# Patient Record
Sex: Female | Born: 2000 | Race: Black or African American | Hispanic: No | Marital: Single | Smoking: Never smoker
Health system: Southern US, Community
[De-identification: ages and names within clinical notes are randomized; demographics above are authoritative.]

## PROBLEM LIST (undated history)

## (undated) HISTORY — PX: MOUTH SURGERY: SHX715

---
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
## 2021-02-26 DIAGNOSIS — H5213 Myopia, bilateral: Secondary | ICD-10-CM | POA: Diagnosis not present

## 2021-03-27 DIAGNOSIS — H5213 Myopia, bilateral: Secondary | ICD-10-CM | POA: Diagnosis not present

## 2021-11-01 ENCOUNTER — Emergency Department (HOSPITAL_COMMUNITY)
Admission: EM | Admit: 2021-11-01 | Discharge: 2021-11-01 | Disposition: A | Discharge status: Home / Self Care | Payer: Medicaid Other | Attending: Emergency Medicine | Admitting: Emergency Medicine

## 2021-11-01 ENCOUNTER — Other Ambulatory Visit: Payer: Self-pay

## 2021-11-01 ENCOUNTER — Emergency Department (HOSPITAL_COMMUNITY): Payer: Medicaid Other

## 2021-11-01 ENCOUNTER — Encounter (HOSPITAL_COMMUNITY): Payer: Self-pay

## 2021-11-01 DIAGNOSIS — Z20822 Contact with and (suspected) exposure to covid-19: Secondary | ICD-10-CM | POA: Insufficient documentation

## 2021-11-01 DIAGNOSIS — R062 Wheezing: Secondary | ICD-10-CM | POA: Diagnosis not present

## 2021-11-01 DIAGNOSIS — R059 Cough, unspecified: Secondary | ICD-10-CM | POA: Diagnosis not present

## 2021-11-01 DIAGNOSIS — B9789 Other viral agents as the cause of diseases classified elsewhere: Secondary | ICD-10-CM | POA: Diagnosis not present

## 2021-11-01 DIAGNOSIS — J069 Acute upper respiratory infection, unspecified: Secondary | ICD-10-CM | POA: Diagnosis not present

## 2021-11-01 LAB — RESP PANEL BY RT-PCR (FLU A&B, COVID) ARPGX2
Influenza A by PCR: NEGATIVE
Influenza B by PCR: NEGATIVE
SARS Coronavirus 2 by RT PCR: NEGATIVE

## 2021-11-01 MED ORDER — PREDNISONE 50 MG PO TABS: 50.0000 mg | ORAL_TABLET | Freq: Every day | ORAL | 0 refills | Status: DC

## 2021-11-01 MED ORDER — PREDNISONE 20 MG PO TABS
60.0000 mg | ORAL_TABLET | Freq: Once | ORAL | Status: AC
Start: 2021-11-01 — End: 2021-11-01
  Administered 2021-11-01: 60 mg via ORAL
  Filled 2021-11-01: qty 3

## 2021-11-01 MED ORDER — ALBUTEROL SULFATE HFA 108 (90 BASE) MCG/ACT IN AERS: 1.0000 | INHALATION_SPRAY | Freq: Four times a day (QID) | RESPIRATORY_TRACT | 0 refills | Status: AC | PRN

## 2021-11-01 MED ORDER — ALBUTEROL SULFATE HFA 108 (90 BASE) MCG/ACT IN AERS
2.0000 | INHALATION_SPRAY | Freq: Once | RESPIRATORY_TRACT | Status: AC
  Administered 2021-11-01: 2 via RESPIRATORY_TRACT
  Filled 2021-11-01: qty 6.7

## 2021-11-01 MED ORDER — ALBUTEROL SULFATE HFA 108 (90 BASE) MCG/ACT IN AERS: 1.0000 | INHALATION_SPRAY | Freq: Four times a day (QID) | RESPIRATORY_TRACT | 0 refills | Status: DC | PRN

## 2021-11-01 MED ORDER — PREDNISONE 50 MG PO TABS: 50.0000 mg | ORAL_TABLET | Freq: Every day | ORAL | 0 refills | Status: AC

## 2021-11-01 NOTE — ED Provider Notes (Signed)
Emergency Medicine Provider Triage Evaluation Note  Jillian Banks , a 20 y.o. female  was evaluated in triage.  Pt complains of cough, wheeze.  No history of asthma.  Girlfriend with similar symptoms.  Diagnosed with viral infection.  No chest pain.  Has some myalgias.  Cough productive of green sputum.  No sore throat.  Denies chance of pregnancy.  Review of Systems  Positive: cough, Wheeze, shortness of breath Negative: Fever, emesis, abdominal pain  Physical Exam  BP 136/82 (BP Location: Left Arm)   Pulse 98   Temp 99.2 F (37.3 C) (Oral)   Resp 16   SpO2 96%  Gen:   Awake, no distress   Resp:  Normal effort, wet cough.  Mild expiratory wheeze. MSK:   Moves extremities without difficulty  Other:    Medical Decision Making  Medically screening exam initiated at 1:15 PM.  Appropriate orders placed.  Cierah Crader was informed that the remainder of the evaluation will be completed by another provider, this initial triage assessment does not replace that evaluation, and the importance of remaining in the ED until their evaluation is complete.  Cough, wheeze   Rilya Longo A, PA-C 11/01/21 1317    Terald Sleeper, MD 11/01/21 [phone]

## 2021-11-01 NOTE — ED Notes (Signed)
An After Visit Summary was printed and given to the patient. Discharge instructions given and no further questions at this time.  

## 2021-11-01 NOTE — ED Triage Notes (Signed)
Patient c/o a productive cough with green sputum and wheezing x 3 days.

## 2021-11-01 NOTE — Discharge Instructions (Signed)
It was our pleasure taking care of you here in the emergency department today  Chest x-ray did not show any evidence of infection  COVID, flu test is negative  You likely have bronchitis.  Take the steroids as prescribed.  You may also do the inhaler, 2 to 4 puffs every 4 hours.  Make sure to hydrate, rest at home  Return for new worsening symptoms

## 2021-11-01 NOTE — ED Provider Notes (Signed)
[facility]EMERGENCY [facility]PT Provider Note   CSN: [id number] Arrival date & time: 11/01/21  1230    History Chief Complaint  Patient presents with   Wheezing   Cough    Jillian Banks is a 20 y.o. female with no significant past medical history who presents for evaluation of cough and wheeze. Had some family members with similar symptoms.  The person was diagnosed with viral illness, had negative COVID, flu.  Patient feels like she has a wheeze. She is coughing up clear and green sputum.  Feels like she has had some chills however has not taken her temperature.  No headache, neck pain, neck stiffness, chest pain, shortness of breath, sore throat, abdominal pain, urinary symptoms.  Denies chance of pregnancy.  No prior history of asthma.  Denies additional aggravating or alleviating factors  History obtained from patient and past medical records.  No interpreter is used.  HPI     History reviewed. No pertinent past medical history.  There are no problems to display for this patient.   Past Surgical History:  Procedure Laterality Date   MOUTH SURGERY       OB History   No obstetric history on file.     Family History  Problem Relation Age of Onset   Hypertension Mother     Social History   Tobacco Use   Smoking status: Never  Vaping Use   Vaping Use: Never used  Substance Use Topics   Alcohol use: Never   Drug use: Never    Home Medications Prior to Admission medications   Medication Sig Start Date End Date Taking? Authorizing Provider  albuterol (VENTOLIN HFA) 108 (90 Base) MCG/ACT inhaler Inhale 1-2 puffs into the lungs every 6 (six) hours as needed for wheezing or shortness of breath. 11/01/21  Yes Torry Adamczak A, PA-C  predniSONE (DELTASONE) 50 MG tablet Take 1 tablet (50 mg total) by mouth daily. 11/01/21  Yes Fleet Higham A, PA-C  omeprazole (PRILOSEC) 20 MG capsule Take 1 capsule (20 mg total) by mouth daily. 04/26/18   Georgetta Haber, NP  ondansetron (ZOFRAN) 4 MG tablet Take 1 tablet (4 mg total) by mouth every 8 (eight) hours as needed for nausea or vomiting. 04/26/18   Georgetta Haber, NP    Allergies    Augmentin [amoxicillin-pot clavulanate]  Review of Systems   Review of Systems  Constitutional:  Positive for fatigue.  HENT: Negative.    Respiratory:  Positive for cough and wheezing. Negative for apnea, choking, chest tightness, shortness of breath and stridor.   Cardiovascular: Negative.   Gastrointestinal: Negative.   Genitourinary: Negative.   Musculoskeletal: Negative.   Skin: Negative.   Neurological: Negative.   All other systems reviewed and are negative.  Physical Exam Updated Vital Signs BP 136/82 (BP Location: Left Arm)   Pulse 98   Temp 99.2 F (37.3 C) (Oral)   Resp 16   Ht 5' (1.524 m)   Wt 49 kg   LMP 10/01/2021 (Exact Date)   SpO2 96%   BMI 21.09 kg/m   Physical Exam Vitals and nursing note reviewed.  Constitutional:      General: She is not in acute distress.    Appearance: She is well-developed. She is not ill-appearing, toxic-appearing or diaphoretic.  HENT:     Head: Normocephalic and atraumatic.     Nose: Nose normal.     Mouth/Throat:     Mouth: Mucous membranes are moist.  Eyes:  Pupils: Pupils are equal, round, and reactive to light.  Cardiovascular:     Rate and Rhythm: Normal rate.     Pulses: Normal pulses.     Heart sounds: Normal heart sounds.     Comments: Heart without murmur Pulmonary:     Effort: Pulmonary effort is normal. No respiratory distress.     Breath sounds: Wheezing present.     Comments: Expiratory wheeze, wet cough.  No respiratory distress Chest:     Comments: Nontender no crepitus Abdominal:     General: Bowel sounds are normal. There is no distension.     Palpations: Abdomen is soft.     Tenderness: There is no abdominal tenderness. There is no guarding or rebound.  Musculoskeletal:        General: No swelling,  tenderness, deformity or signs of injury. Normal range of motion.     Cervical back: Normal range of motion.     Right lower leg: No edema.     Left lower leg: No edema.     Comments: Compartments soft, moves all 4 extremities  Skin:    General: Skin is warm and dry.     Capillary Refill: Capillary refill takes less than 2 seconds.  Neurological:     General: No focal deficit present.     Mental Status: She is alert and oriented to person, place, and time.  Psychiatric:        Mood and Affect: Mood normal.    ED Results / Procedures / Treatments   Labs (all labs ordered are listed, but only abnormal results are displayed) Labs Reviewed  RESP PANEL BY RT-PCR (FLU A&B, COVID) [id number]    EKG None  Radiology DG Chest 2 View  Result Date: 11/01/2021 CLINICAL DATA:  20 year old female with cough and wheezing. EXAM: CHEST - 2 VIEW COMPARISON:  None. FINDINGS: The mediastinal contours are within normal limits. No cardiomegaly. The lungs are clear bilaterally without evidence of focal consolidation, pleural effusion, or pneumothorax. No acute osseous abnormality. IMPRESSION: No acute cardiopulmonary process. Electronically Signed   By: Marliss Coots M.D.   On: 11/01/2021 14:15    Procedures Procedures   Medications Ordered in ED Medications  albuterol (VENTOLIN HFA) 108 (90 Base) MCG/ACT inhaler 2 puff (2 puffs Inhalation Given 11/01/21 1328)  predniSONE (DELTASONE) tablet 60 mg (60 mg Oral Given 11/01/21 1526)    ED Course  I have reviewed the triage vital signs and the nursing notes.  Pertinent labs & imaging results that were available during my care of the patient were reviewed by me and considered in my medical decision making (see chart for details).  Here for evaluation of cough and wheeze.  She is afebrile, nonseptic, not ill-appearing.  Family members with similar symptoms.  Does have mild expiratory wheeze.  She has productive cough in room.  Her heart is clear.  Her  abdomen is soft, nontender.  No clinical evidence of VTE on exam.  She denies any chest pain.  She is PERC negative, Wells criteria low risk.  Posterior oropharynx clear.  Will give albuterol.  Chest x-ray personally reviewed does not show any evidence of infiltrates, cardiomegaly, pulmonary edema, pneumothorax COVID, flu negative  Patient reassessed.  Has had some improvement in breath sounds.  Suspect patient with viral URI, likely bronchitis.  DC home with symptomatic management.  Discussed rest, return for new or worsening symptoms, please follow-up with PCP.  She is agreeable.  The patient has been appropriately medically screened and/or  stabilized in the ED. I have low suspicion for any other emergent medical condition which would require further screening, evaluation or treatment in the ED or require inpatient management.  Patient is hemodynamically stable and in no acute distress.  Patient able to ambulate in department prior to ED.  Evaluation does not show acute pathology that would require ongoing or additional emergent interventions while in the emergency department or further inpatient treatment.  I have discussed the diagnosis with the patient and answered all questions.  Pain is been managed while in the emergency department and patient has no further complaints prior to discharge.  Patient is comfortable with plan discussed in room and is stable for discharge at this time.  I have discussed strict return precautions for returning to the emergency department.  Patient was encouraged to follow-up with PCP/specialist refer to at discharge.     MDM Rules/Calculators/A&P                            Final Clinical Impression(s) / ED Diagnoses Final diagnoses:  Viral URI with cough  Wheeze    Rx / DC Orders ED Discharge Orders          Ordered    albuterol (VENTOLIN HFA) 108 (90 Base) MCG/ACT inhaler  Every 6 hours PRN        11/01/21 1549    predniSONE (DELTASONE) 50 MG tablet   Daily        11/01/21 1549             Dazia Lippold A, PA-C 11/01/21 1554    Terald Sleeper, MD 11/01/21 1614

## 2021-12-13 DIAGNOSIS — Z03818 Encounter for observation for suspected exposure to other biological agents ruled out: Secondary | ICD-10-CM | POA: Diagnosis not present

## 2021-12-13 DIAGNOSIS — Z20822 Contact with and (suspected) exposure to covid-19: Secondary | ICD-10-CM | POA: Diagnosis not present

## 2021-12-13 DIAGNOSIS — R051 Acute cough: Secondary | ICD-10-CM | POA: Diagnosis not present

## 2021-12-13 DIAGNOSIS — J069 Acute upper respiratory infection, unspecified: Secondary | ICD-10-CM | POA: Diagnosis not present

## 2022-03-19 ENCOUNTER — Emergency Department (HOSPITAL_COMMUNITY)
Admission: EM | Admit: 2022-03-19 | Discharge: 2022-03-19 | Disposition: A | Discharge status: Home / Self Care | Payer: Medicaid Other | Attending: Emergency Medicine | Admitting: Emergency Medicine

## 2022-03-19 ENCOUNTER — Encounter (HOSPITAL_COMMUNITY): Payer: Self-pay

## 2022-03-19 ENCOUNTER — Other Ambulatory Visit: Payer: Self-pay

## 2022-03-19 DIAGNOSIS — E860 Dehydration: Secondary | ICD-10-CM | POA: Insufficient documentation

## 2022-03-19 DIAGNOSIS — R197 Diarrhea, unspecified: Secondary | ICD-10-CM | POA: Insufficient documentation

## 2022-03-19 DIAGNOSIS — R112 Nausea with vomiting, unspecified: Secondary | ICD-10-CM | POA: Insufficient documentation

## 2022-03-19 DIAGNOSIS — R0981 Nasal congestion: Secondary | ICD-10-CM | POA: Diagnosis not present

## 2022-03-19 DIAGNOSIS — D72829 Elevated white blood cell count, unspecified: Secondary | ICD-10-CM | POA: Diagnosis not present

## 2022-03-19 DIAGNOSIS — J45909 Unspecified asthma, uncomplicated: Secondary | ICD-10-CM | POA: Diagnosis not present

## 2022-03-19 LAB — CBC
HCT: 47.2 % — ABNORMAL HIGH (ref 36.0–46.0)
Hemoglobin: 15.8 g/dL — ABNORMAL HIGH (ref 12.0–15.0)
MCH: 30.4 pg (ref 26.0–34.0)
MCHC: 33.5 g/dL (ref 30.0–36.0)
MCV: 90.9 fL (ref 80.0–100.0)
Platelets: 209 10*3/uL (ref 150–400)
RBC: 5.19 MIL/uL — ABNORMAL HIGH (ref 3.87–5.11)
RDW: 11.5 % (ref 11.5–15.5)
WBC: 16.8 10*3/uL — ABNORMAL HIGH (ref 4.0–10.5)
nRBC: 0 % (ref 0.0–0.2)

## 2022-03-19 LAB — I-STAT BETA HCG BLOOD, ED (MC, WL, AP ONLY): I-stat hCG, quantitative: <5 m[IU]/mL (ref <5)

## 2022-03-19 LAB — COMPREHENSIVE METABOLIC PANEL
ALT: 31 U/L (ref 0–44)
AST: 32 U/L (ref 15–41)
Albumin: 4.8 g/dL (ref 3.5–5.0)
Alkaline Phosphatase: 66 U/L (ref 38–126)
Anion gap: 9 (ref 5–15)
BUN: 11 mg/dL (ref 6–20)
CO2: 23 mmol/L (ref 22–32)
Calcium: 9.8 mg/dL (ref 8.9–10.3)
Chloride: 105 mmol/L (ref 98–111)
Creatinine, Ser: 0.81 mg/dL (ref 0.44–1.00)
GFR, Estimated: >60 mL/min (ref >60)
Glucose, Bld: 134 mg/dL — ABNORMAL HIGH (ref 70–99)
Potassium: 4.1 mmol/L (ref 3.5–5.1)
Sodium: 137 mmol/L (ref 135–145)
Total Bilirubin: 1.1 mg/dL (ref 0.3–1.2)
Total Protein: 8.6 g/dL — ABNORMAL HIGH (ref 6.5–8.1)

## 2022-03-19 LAB — URINALYSIS, ROUTINE W REFLEX MICROSCOPIC
Bilirubin Urine: NEGATIVE
Glucose, UA: NEGATIVE mg/dL
Hgb urine dipstick: NEGATIVE
Ketones, ur: 20 mg/dL — AB
Leukocytes,Ua: NEGATIVE
Nitrite: NEGATIVE
Protein, ur: NEGATIVE mg/dL
Specific Gravity, Urine: 1.025 (ref 1.005–1.030)
pH: 5 (ref 5.0–8.0)

## 2022-03-19 LAB — LIPASE, BLOOD: Lipase: 30 U/L (ref 11–51)

## 2022-03-19 MED ORDER — ONDANSETRON HCL 4 MG/2ML IJ SOLN
4.0000 mg | Freq: Once | INTRAMUSCULAR | Status: AC
  Administered 2022-03-19: 4 mg via INTRAVENOUS
  Filled 2022-03-19: qty 2

## 2022-03-19 MED ORDER — DICYCLOMINE HCL 10 MG PO CAPS
20.0000 mg | ORAL_CAPSULE | Freq: Once | ORAL | Status: AC
  Administered 2022-03-19: 20 mg via ORAL
  Filled 2022-03-19: qty 2

## 2022-03-19 MED ORDER — ONDANSETRON 4 MG PO TBDP: 4.0000 mg | ORAL_TABLET | Freq: Three times a day (TID) | ORAL | 0 refills | Status: AC | PRN

## 2022-03-19 MED ORDER — LACTATED RINGERS IV BOLUS
1000.0000 mL | Freq: Once | INTRAVENOUS | Status: AC
  Administered 2022-03-19: 1000 mL via INTRAVENOUS

## 2022-03-19 MED ORDER — DICYCLOMINE HCL 20 MG PO TABS: 20.0000 mg | ORAL_TABLET | Freq: Two times a day (BID) | ORAL | 0 refills | Status: AC

## 2022-03-19 NOTE — ED Provider Notes (Signed)
?[facility] ?Provider Note ? ? ?CSN: [id number] ?Arrival date & time: 03/19/22  0222 ? ?  ? ?History ? ?Chief Complaint  ?Patient presents with  ? Emesis  ? ? ?Jillian Banks is a 21 y.o. female. ? ? ?Emesis ? ?Patient is a 21 year old female with past medical history significant for asthma ? ?She is presented to emergency room today with complaints of nausea, vomiting, diarrhea she states her symptoms began around 6/7 PM yesterday evening.  Seems that since that time she has had nearly hourly episodes of loose watery diarrhea.  No blood in her stool no fevers no recent antibiotic use no recent travel, also no history of C. difficile. ? ?She states that she has had nonbloody nonbilious emesis at a relatively similar rate as her diarrhea.  She denies any chest pain or difficulty breathing ? ?She states she has also noticed some increased sinus congestion. ?  ? ?Home Medications ?Prior to Admission medications   ?Medication Sig Start Date End Date Taking? Authorizing Provider  ?dicyclomine (BENTYL) 20 MG tablet Take 1 tablet (20 mg total) by mouth 2 (two) times daily. 03/19/22  Yes Gailen Shelter, PA  ?ondansetron (ZOFRAN-ODT) 4 MG disintegrating tablet Take 1 tablet (4 mg total) by mouth every 8 (eight) hours as needed for nausea or vomiting. 03/19/22  Yes Valeri Sula S, PA  ?albuterol (VENTOLIN HFA) 108 (90 Base) MCG/ACT inhaler Inhale 1-2 puffs into the lungs every 6 (six) hours as needed for wheezing or shortness of breath. 11/01/21   Henderly, Britni A, PA-C  ?omeprazole (PRILOSEC) 20 MG capsule Take 1 capsule (20 mg total) by mouth daily. 04/26/18   Georgetta Haber, NP  ?ondansetron (ZOFRAN) 4 MG tablet Take 1 tablet (4 mg total) by mouth every 8 (eight) hours as needed for nausea or vomiting. 04/26/18   Georgetta Haber, NP  ?predniSONE (DELTASONE) 50 MG tablet Take 1 tablet (50 mg total) by mouth daily. 11/01/21   Henderly, Britni A, PA-C  ?   ? ?Allergies    ?Augmentin  [amoxicillin-pot clavulanate]   ? ?Review of Systems   ?Review of Systems  ?Gastrointestinal:  Positive for vomiting.  ? ?Physical Exam ?Updated Vital Signs ?BP 131/87 (BP Location: Right Arm)   Pulse 99   Temp (!) 97 ?F (36.1 ?C) (Oral)   Resp 17   Ht 5' (1.524 m)   Wt 50.8 kg   SpO2 100%   BMI 21.87 kg/m?  ?Physical Exam ?Vitals and nursing note reviewed.  ?Constitutional:   ?   General: She is not in acute distress. ?HENT:  ?   Head: Normocephalic and atraumatic.  ?   Nose: Nose normal.  ?   Mouth/Throat:  ?   Mouth: Mucous membranes are dry.  ?Eyes:  ?   General: No scleral icterus. ?Cardiovascular:  ?   Rate and Rhythm: Regular rhythm. Tachycardia present.  ?   Pulses: Normal pulses.  ?   Heart sounds: Normal heart sounds.  ?   Comments: Heart rate 105 ?Pulmonary:  ?   Effort: Pulmonary effort is normal. No respiratory distress.  ?   Breath sounds: No wheezing.  ?Abdominal:  ?   Palpations: Abdomen is soft.  ?   Tenderness: There is no abdominal tenderness. There is no right CVA tenderness, left CVA tenderness, guarding or rebound.  ?Musculoskeletal:  ?   Cervical back: Normal range of motion.  ?   Right lower leg: No edema.  ?  Left lower leg: No edema.  ?Skin: ?   General: Skin is warm and dry.  ?   Capillary Refill: Capillary refill takes less than 2 seconds.  ?Neurological:  ?   Mental Status: She is alert. Mental status is at baseline.  ?Psychiatric:     ?   Mood and Affect: Mood normal.     ?   Behavior: Behavior normal.  ? ? ?ED Results / Procedures / Treatments   ?Labs ?(all labs ordered are listed, but only abnormal results are displayed) ?Labs Reviewed  ?COMPREHENSIVE METABOLIC PANEL - Abnormal; Notable for the following components:  ?    Result Value  ? Glucose, Bld 134 (*)   ? Total Protein 8.6 (*)   ? All other components within normal limits  ?CBC - Abnormal; Notable for the following components:  ? WBC 16.8 (*)   ? RBC 5.19 (*)   ? Hemoglobin 15.8 (*)   ? HCT 47.2 (*)   ? All other  components within normal limits  ?LIPASE, BLOOD  ?URINALYSIS, ROUTINE W REFLEX MICROSCOPIC  ?I-STAT BETA HCG BLOOD, ED ([facility], [facility], [facility] ONLY)  ? ? ?EKG ?None ? ?Radiology ?No results found. ? ?Procedures ?Procedures  ? ? ?Medications Ordered in ED ?Medications  ?dicyclomine (BENTYL) capsule 20 mg (has no administration in time range)  ?ondansetron (ZOFRAN) injection 4 mg (has no administration in time range)  ?lactated ringers bolus 1,000 mL (has no administration in time range)  ? ? ?ED Course/ Medical Decision Making/ A&P ?  ?                        ?Medical Decision Making ?Amount and/or Complexity of Data Reviewed ?Labs: ordered. ? ? ?Patient is a 21 year old female with past medical history significant for asthma ? ?She is presented to emergency room today with complaints of nausea, vomiting, diarrhea she states her symptoms began around 6/7 PM yesterday evening.  Seems that since that time she has had nearly hourly episodes of loose watery diarrhea.  No blood in her stool no fevers no recent antibiotic use no recent travel, also no history of C. difficile. ? ?She states that she has had nonbloody nonbilious emesis at a relatively similar rate as her diarrhea.  She denies any chest pain or difficulty breathing ? ?She states she has also noticed some increased sinus congestion. ? ? ? ?I personally reviewed all laboratory work and imaging. ? ?Metabolic panel without any acute abnormality specifically kidney function within normal limits and no significant electrolyte abnormalities. ?CBC with leukocytosis of 16.8 likely secondary to forceful emesis.  Somewhat elevated hemoglobin likely from hemoconcentration from dehydration.  ? ?I states she is a negative pregnancy.  Lipase within normal limits. ? ?Urinalysis pending at this time.  She states she has had decreased urine output today I suspect she is severely dehydrated.  1 L lactated Ringer's.  Bentyl, Zofran she will need to be p.o. challenge after this. ? ?She  denies any vaginal discharge or urinary issues.  Her symptoms seem to be gastrointestinal in origin and I suspect a viral illness. ? ?Patient care handed off to oncoming PA RR.  ? ?Anticipate discharge home after hydration and p.o. challenge.  We will go ahead and send prescription for Bentyl and Zofran.  Urinalysis pending at handoff ? ? ?Final Clinical Impression(s) / ED Diagnoses ?Final diagnoses:  ?Nausea vomiting and diarrhea  ? ? ?Rx / DC Orders ?ED Discharge Orders   ? ?  Ordered  ?  dicyclomine (BENTYL) 20 MG tablet  2 times daily       ? 03/19/22 0609  ?  ondansetron (ZOFRAN-ODT) 4 MG disintegrating tablet  Every 8 hours PRN       ? 03/19/22 0609  ? ?  ?  ? ?  ? ? ?  ?Gailen ShelterFondaw, Donyetta Ogletree S, GeorgiaPA ?03/19/22 [id number] ? ?  ?Dione BoozeGlick, David, MD ?03/19/22 2242 ? ?

## 2022-03-19 NOTE — Discharge Instructions (Signed)
Please take Zofran every 8 hours for the next 24 hours.  After that he can take it only as needed for nausea and vomiting.  Will dissolve under your tongue.  Room plenty of water, Pedialyte, Gatorade, make sure that you follow-up with your primary care doctor.  Use Bentyl for abdominal pain.  Bland foods per discharge instructions attached to back of packet.  ?

## 2022-03-19 NOTE — ED Notes (Signed)
Pt reporting resolution of symptoms. Will inform ED provider.  ?

## 2022-03-19 NOTE — ED Triage Notes (Signed)
Patient has been vomiting and having diarrhea since 7pm.  ?

## 2022-03-19 NOTE — ED Notes (Signed)
Informed pt of the need for urine. 

## 2022-03-19 NOTE — ED Provider Notes (Signed)
?  Physical Exam  ?BP 107/66 (BP Location: Right Arm)   Pulse 93   Temp 98.8 ?F (37.1 ?C) (Oral)   Resp 18   Ht 5' (1.524 m)   Wt 50.8 kg   SpO2 99%   BMI 21.87 kg/m?  ? ?Physical Exam ?Vitals and nursing note reviewed.  ?Constitutional:   ?   General: She is not in acute distress. ?   Appearance: Normal appearance. She is not toxic-appearing.  ?Eyes:  ?   General: No scleral icterus. ?Pulmonary:  ?   Effort: Pulmonary effort is normal. No respiratory distress.  ?Abdominal:  ?   Palpations: Abdomen is soft.  ?   Tenderness: There is no abdominal tenderness. There is no guarding or rebound.  ?Skin: ?   General: Skin is dry.  ?   Findings: No rash.  ?Neurological:  ?   General: No focal deficit present.  ?   Mental Status: She is alert. Mental status is at baseline.  ? ? ?Procedures  ?Procedures ? ?ED Course / MDM  ?  ?Medical Decision Making ?Amount and/or Complexity of Data Reviewed ?Labs: ordered. ? ?Risk ?Prescription drug management. ? ? ?Accepted handoff at shift change from [facility], PA-C. Please see prior provider note for more detail.  ? ?Briefly: Patient is 21 y.o. F presenting for nausea, vomiting, and diarrhea.  ? ?Leukocytosis, although likely hemoconcentration given the polycythemia as well form her dehydration. No gross abnormalities on CMP.  ? ?IV fluids initiated. Patient's urinalysis shows 20 ketones, otherwise normal. PO challenge initiated and the patient passed. Return precautions discussed. Work note provided. Education and prescriptions provided. Close follow up with PCP recommended. Patient is stable and being discharged home in good condition.  ? ? ? ? ? ? ?  ?Achille Rich, PA-C ?03/19/22 1738 ? ?  ?Gloris Manchester, MD ?03/22/22 1547 ? ?

## 2022-03-19 NOTE — ED Notes (Signed)
Patient still unable to provide urine sample

## 2022-03-20 ENCOUNTER — Telehealth: Payer: Self-pay

## 2022-03-20 NOTE — Telephone Encounter (Signed)
Transition Care Management Follow-up Telephone Call ?Date of discharge and from where: 03/19/2022-[place]  ?How have you been since you were released from the hospital? Pt stated she is doing fine and the medicine helped a lot. ?Any questions or concerns? No ? ?Items Reviewed: ?Did the pt receive and understand the discharge instructions provided? Yes  ?Medications obtained and verified? Yes  ?Other? No  ?Any new allergies since your discharge? No  ?Dietary orders reviewed? No ?Do you have support at home? Yes  ? ?Home Care and Equipment/Supplies: ?Were home health services ordered? not applicable ?If so, what is the name of the agency? N/A  ?Has the agency set up a time to come to the patient's home? not applicable ?Were any new equipment or medical supplies ordered?  No ?What is the name of the medical supply agency? N/A ?Were you able to get the supplies/equipment? not applicable ?Do you have any questions related to the use of the equipment or supplies? No ? ?Functional Questionnaire: (I = Independent and D = Dependent) ?ADLs: I ? ?Bathing/Dressing- I ? ?Meal Prep- I ? ?Eating- I ? ?Maintaining continence- I ? ?Transferring/Ambulation- I ? ?Managing Meds- I ? ?Follow up appointments reviewed: ? ?PCP Hospital f/u appt confirmed? No   ?Specialist Hospital f/u appt confirmed? No   ?Are transportation arrangements needed? No  ?If their condition worsens, is the pt aware to call PCP or go to the Emergency Dept.? Yes ?Was the patient provided with contact information for the PCP's office or ED? Yes ?Was to pt encouraged to call back with questions or concerns? Yes  ?

## 2022-05-05 IMAGING — CR DG CHEST 2V
2 series · 2 of 2 positions shown · non-contrast
Comparison: None.

CLINICAL DATA: 20-year-old female with cough and wheezing.

EXAM:
CHEST - 2 VIEW

[w chest pa]
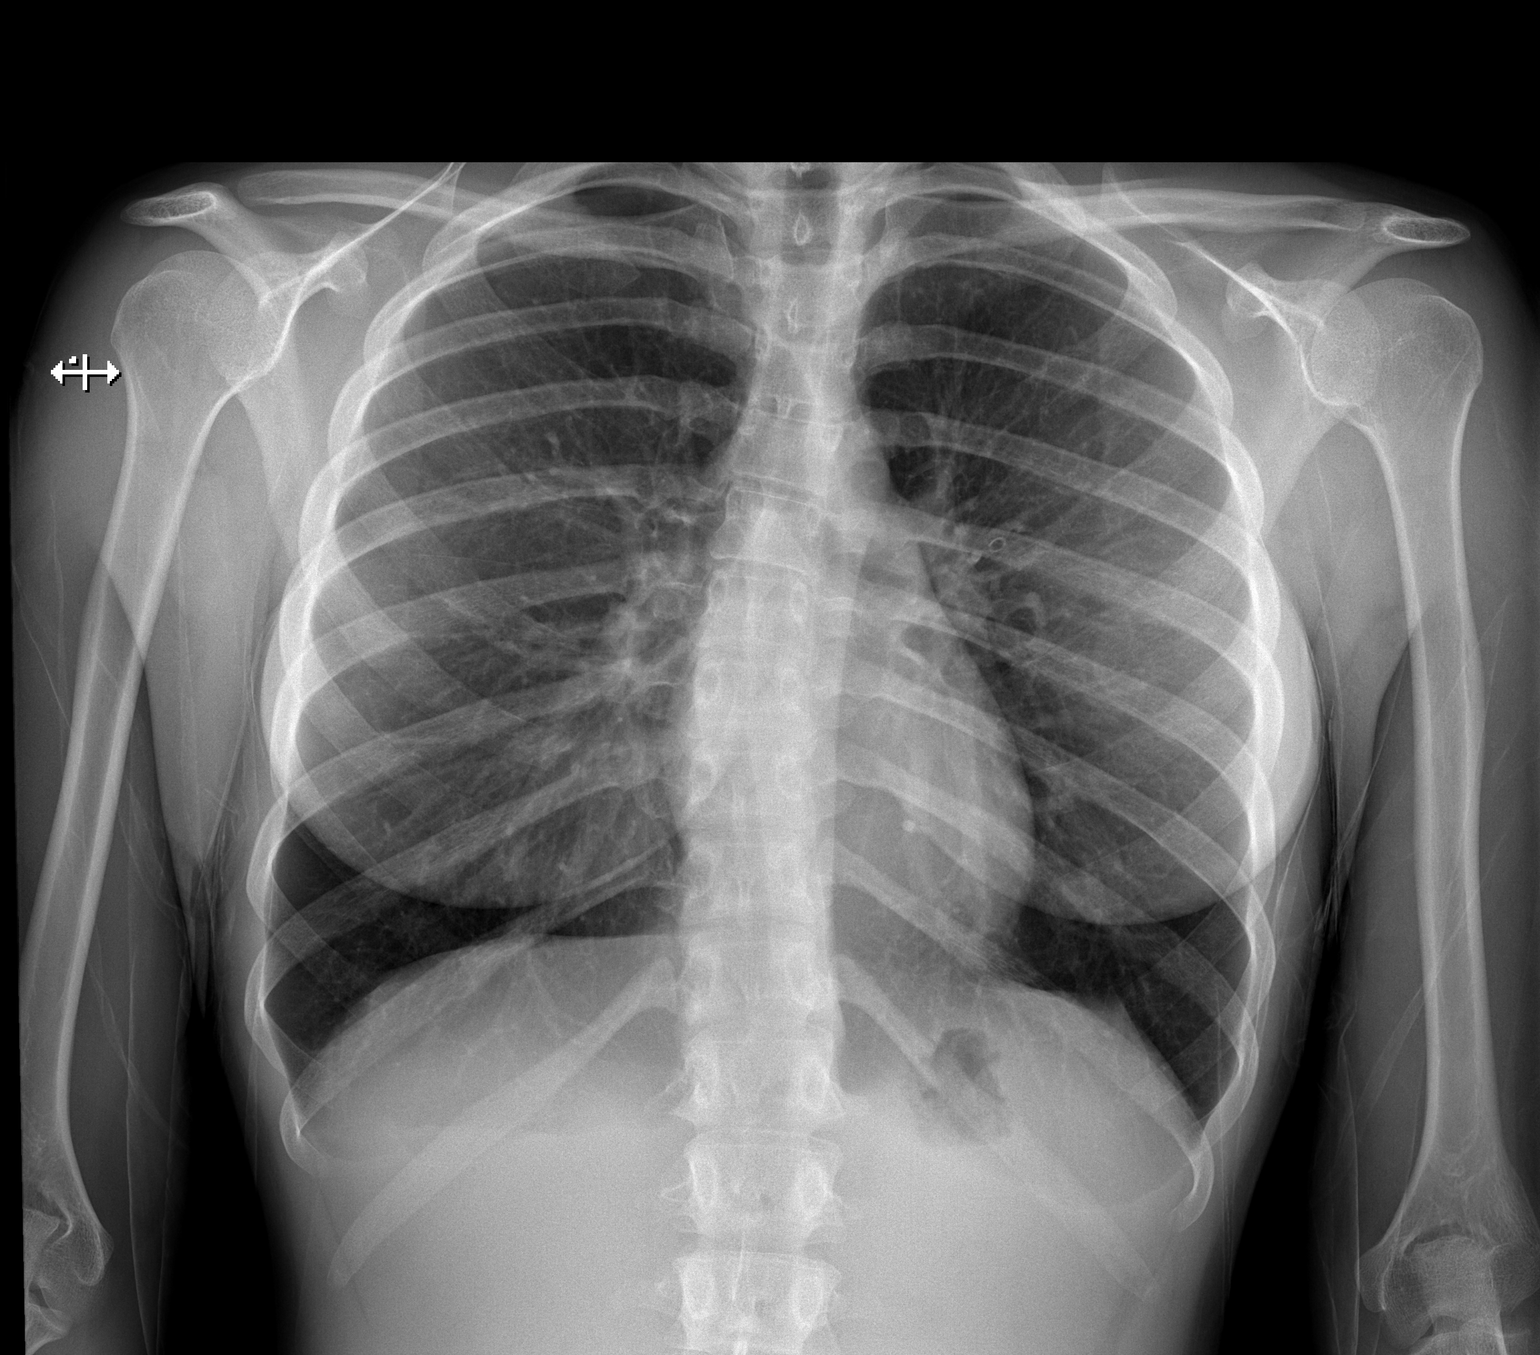

[w chest lat]
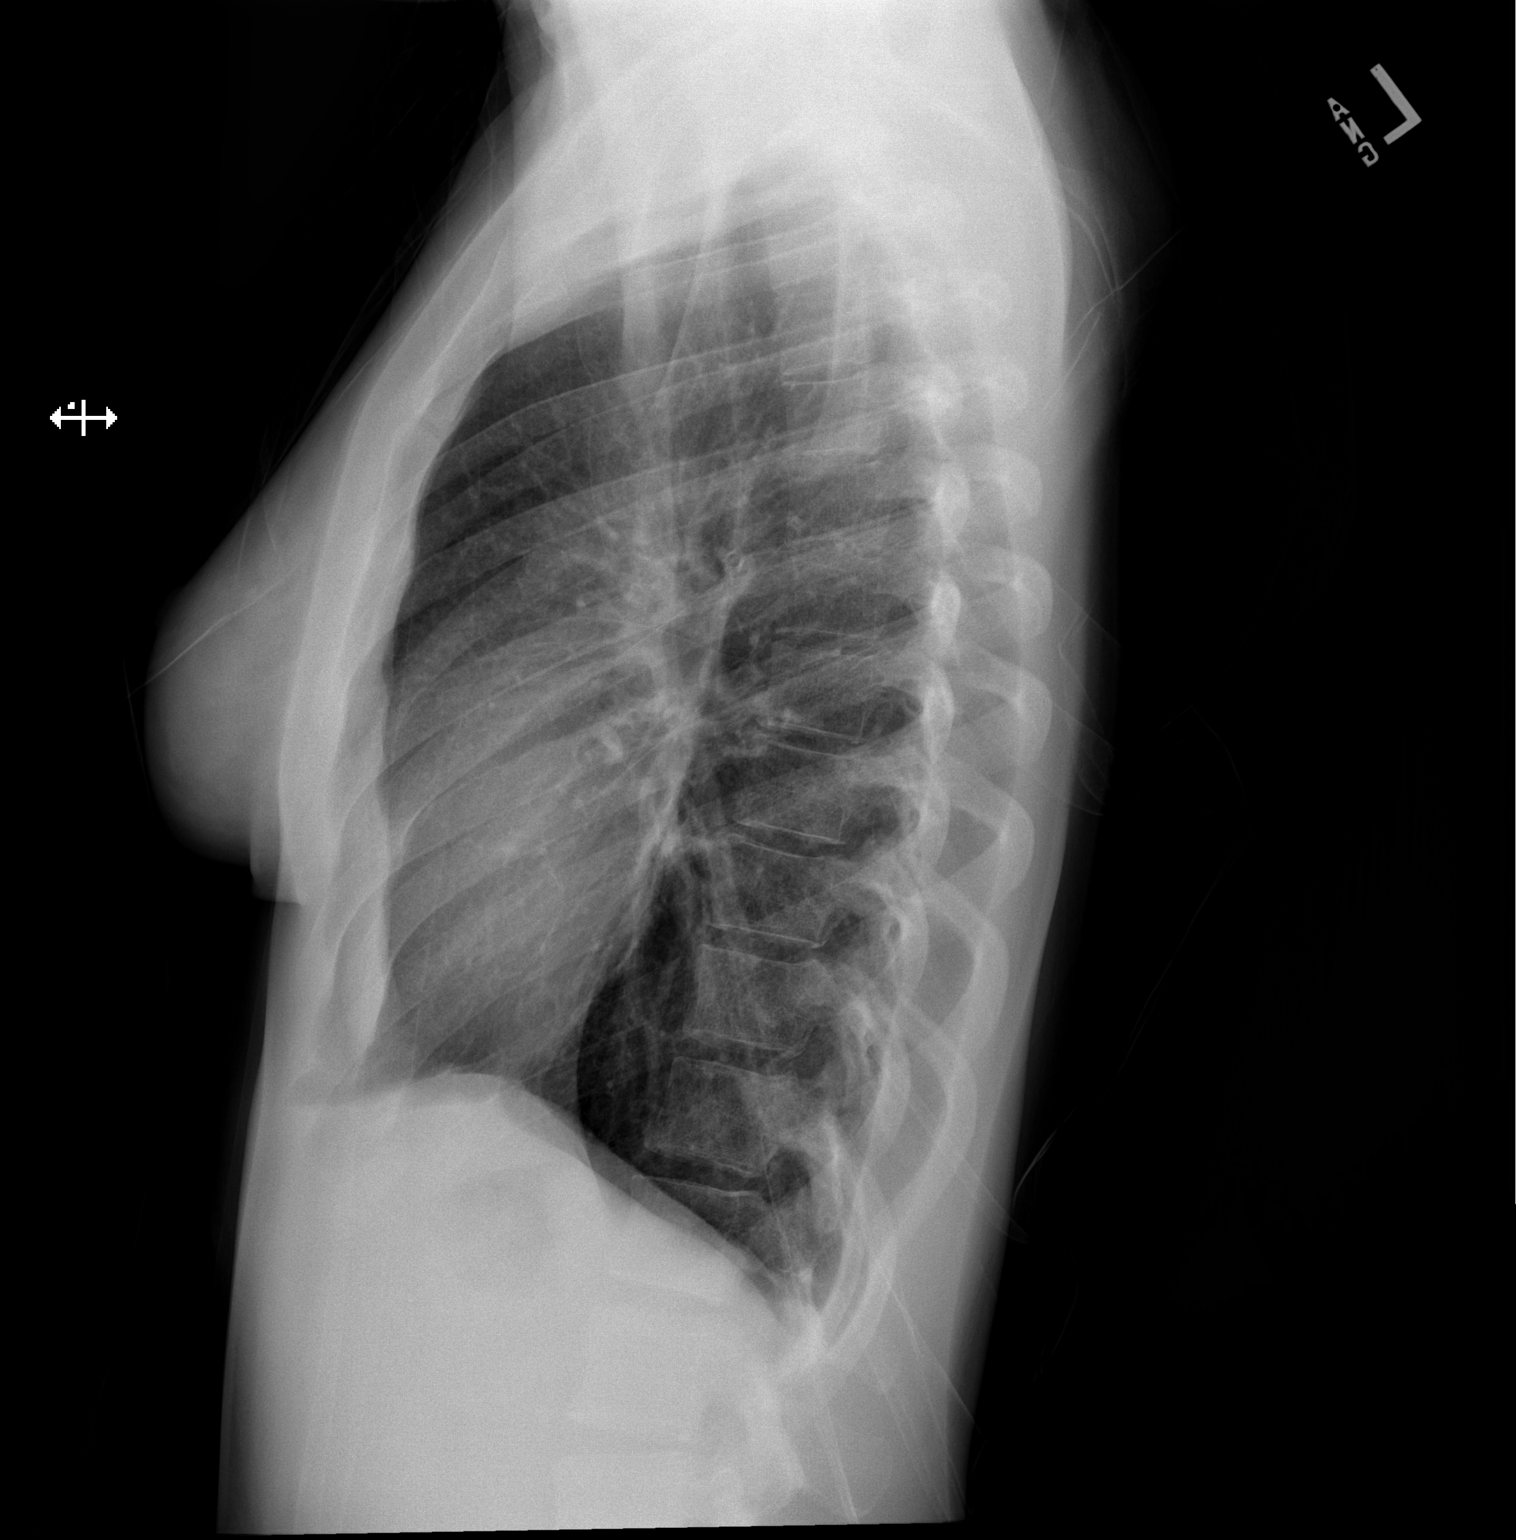

[2 of 2 positions shown; findings below may reference images not displayed]

FINDINGS: The mediastinal contours are within normal limits. No cardiomegaly.
The lungs are clear bilaterally without evidence of focal
consolidation, pleural effusion, or pneumothorax. No acute osseous
abnormality.
IMPRESSION: No acute cardiopulmonary process.

## 2022-05-19 DIAGNOSIS — R052 Subacute cough: Secondary | ICD-10-CM | POA: Diagnosis not present

## 2022-05-19 DIAGNOSIS — Z87898 Personal history of other specified conditions: Secondary | ICD-10-CM | POA: Diagnosis not present

## 2022-05-19 DIAGNOSIS — J209 Acute bronchitis, unspecified: Secondary | ICD-10-CM | POA: Diagnosis not present

## 2023-04-14 ENCOUNTER — Telehealth: Payer: Self-pay

## 2023-04-14 NOTE — Telephone Encounter (Signed)
LVM for patient to call back. AS, CMA
